# Patient Record
Sex: Female | Born: 1970 | Race: White | Hispanic: No | Smoking: Never smoker
Health system: Southern US, Community
[De-identification: ages and names within clinical notes are randomized; demographics above are authoritative.]

## PROBLEM LIST (undated history)

## (undated) DIAGNOSIS — F410 Panic disorder [episodic paroxysmal anxiety] without agoraphobia: Secondary | ICD-10-CM

## (undated) DIAGNOSIS — F32A Depression, unspecified: Secondary | ICD-10-CM

## (undated) DIAGNOSIS — F419 Anxiety disorder, unspecified: Secondary | ICD-10-CM

## (undated) DIAGNOSIS — F329 Major depressive disorder, single episode, unspecified: Secondary | ICD-10-CM

## (undated) DIAGNOSIS — G4700 Insomnia, unspecified: Secondary | ICD-10-CM

## (undated) HISTORY — PX: GASTRECTOMY: SHX58

## (undated) HISTORY — PX: ABDOMINAL HYSTERECTOMY: SHX81

---

## 2004-05-28 ENCOUNTER — Ambulatory Visit: Payer: Self-pay | Admitting: Family Medicine

## 2005-12-16 DIAGNOSIS — M545 Low back pain, unspecified: Secondary | ICD-10-CM | POA: Insufficient documentation

## 2006-02-09 ENCOUNTER — Ambulatory Visit: Payer: Self-pay | Admitting: Obstetrics and Gynecology

## 2006-03-23 ENCOUNTER — Ambulatory Visit: Payer: Self-pay | Admitting: Obstetrics and Gynecology

## 2006-08-12 ENCOUNTER — Ambulatory Visit: Payer: Self-pay | Admitting: Obstetrics and Gynecology

## 2006-08-17 ENCOUNTER — Ambulatory Visit: Payer: Self-pay | Admitting: Obstetrics and Gynecology

## 2006-08-21 ENCOUNTER — Inpatient Hospital Stay: Payer: Self-pay | Admitting: Obstetrics and Gynecology

## 2007-08-10 ENCOUNTER — Emergency Department: Payer: Self-pay | Admitting: Emergency Medicine

## 2007-09-21 DIAGNOSIS — K219 Gastro-esophageal reflux disease without esophagitis: Secondary | ICD-10-CM | POA: Insufficient documentation

## 2008-04-26 ENCOUNTER — Ambulatory Visit: Payer: Self-pay | Admitting: Obstetrics and Gynecology

## 2009-02-03 DIAGNOSIS — N809 Endometriosis, unspecified: Secondary | ICD-10-CM | POA: Insufficient documentation

## 2009-02-03 DIAGNOSIS — E569 Vitamin deficiency, unspecified: Secondary | ICD-10-CM | POA: Insufficient documentation

## 2009-02-03 DIAGNOSIS — F4321 Adjustment disorder with depressed mood: Secondary | ICD-10-CM | POA: Insufficient documentation

## 2010-09-27 DIAGNOSIS — N958 Other specified menopausal and perimenopausal disorders: Secondary | ICD-10-CM | POA: Insufficient documentation

## 2011-08-25 DIAGNOSIS — M47817 Spondylosis without myelopathy or radiculopathy, lumbosacral region: Secondary | ICD-10-CM | POA: Insufficient documentation

## 2011-09-09 DIAGNOSIS — Z980 Intestinal bypass and anastomosis status: Secondary | ICD-10-CM | POA: Insufficient documentation

## 2011-11-10 DIAGNOSIS — Z79899 Other long term (current) drug therapy: Secondary | ICD-10-CM | POA: Insufficient documentation

## 2011-11-25 DIAGNOSIS — M4310 Spondylolisthesis, site unspecified: Secondary | ICD-10-CM | POA: Insufficient documentation

## 2012-09-10 DIAGNOSIS — G43909 Migraine, unspecified, not intractable, without status migrainosus: Secondary | ICD-10-CM | POA: Insufficient documentation

## 2012-09-10 DIAGNOSIS — Z9884 Bariatric surgery status: Secondary | ICD-10-CM | POA: Insufficient documentation

## 2012-09-10 DIAGNOSIS — Z90710 Acquired absence of both cervix and uterus: Secondary | ICD-10-CM | POA: Insufficient documentation

## 2013-01-14 DIAGNOSIS — F410 Panic disorder [episodic paroxysmal anxiety] without agoraphobia: Secondary | ICD-10-CM | POA: Insufficient documentation

## 2013-01-15 DIAGNOSIS — R4589 Other symptoms and signs involving emotional state: Secondary | ICD-10-CM | POA: Insufficient documentation

## 2013-04-22 ENCOUNTER — Emergency Department: Payer: Self-pay | Admitting: Emergency Medicine

## 2014-03-08 DIAGNOSIS — R7301 Impaired fasting glucose: Secondary | ICD-10-CM | POA: Insufficient documentation

## 2015-07-17 ENCOUNTER — Emergency Department
Admission: EM | Admit: 2015-07-17 | Discharge: 2015-07-17 | Disposition: A | Discharge status: Home / Self Care | Payer: Medicare Other | Attending: Emergency Medicine | Admitting: Emergency Medicine

## 2015-07-17 ENCOUNTER — Emergency Department: Payer: Medicare Other

## 2015-07-17 ENCOUNTER — Encounter: Payer: Self-pay | Admitting: Emergency Medicine

## 2015-07-17 DIAGNOSIS — S20212A Contusion of left front wall of thorax, initial encounter: Secondary | ICD-10-CM | POA: Diagnosis not present

## 2015-07-17 DIAGNOSIS — M545 Low back pain: Secondary | ICD-10-CM | POA: Diagnosis not present

## 2015-07-17 DIAGNOSIS — G8929 Other chronic pain: Secondary | ICD-10-CM

## 2015-07-17 DIAGNOSIS — Y999 Unspecified external cause status: Secondary | ICD-10-CM | POA: Insufficient documentation

## 2015-07-17 DIAGNOSIS — S62327A Displaced fracture of shaft of fifth metacarpal bone, left hand, initial encounter for closed fracture: Secondary | ICD-10-CM | POA: Diagnosis not present

## 2015-07-17 DIAGNOSIS — S62308A Unspecified fracture of other metacarpal bone, initial encounter for closed fracture: Secondary | ICD-10-CM

## 2015-07-17 DIAGNOSIS — F329 Major depressive disorder, single episode, unspecified: Secondary | ICD-10-CM | POA: Insufficient documentation

## 2015-07-17 DIAGNOSIS — Y9389 Activity, other specified: Secondary | ICD-10-CM | POA: Diagnosis not present

## 2015-07-17 DIAGNOSIS — Y92410 Unspecified street and highway as the place of occurrence of the external cause: Secondary | ICD-10-CM | POA: Diagnosis not present

## 2015-07-17 DIAGNOSIS — S62325A Displaced fracture of shaft of fourth metacarpal bone, left hand, initial encounter for closed fracture: Secondary | ICD-10-CM | POA: Diagnosis not present

## 2015-07-17 DIAGNOSIS — M549 Dorsalgia, unspecified: Secondary | ICD-10-CM

## 2015-07-17 DIAGNOSIS — S60922A Unspecified superficial injury of left hand, initial encounter: Secondary | ICD-10-CM | POA: Diagnosis present

## 2015-07-17 DIAGNOSIS — Z23 Encounter for immunization: Secondary | ICD-10-CM | POA: Diagnosis not present

## 2015-07-17 DIAGNOSIS — S91322A Laceration with foreign body, left foot, initial encounter: Secondary | ICD-10-CM | POA: Insufficient documentation

## 2015-07-17 HISTORY — DX: Major depressive disorder, single episode, unspecified: F32.9

## 2015-07-17 HISTORY — DX: Depression, unspecified: F32.A

## 2015-07-17 HISTORY — DX: Panic disorder (episodic paroxysmal anxiety): F41.0

## 2015-07-17 HISTORY — DX: Insomnia, unspecified: G47.00

## 2015-07-17 HISTORY — DX: Anxiety disorder, unspecified: F41.9

## 2015-07-17 LAB — CBC WITH DIFFERENTIAL/PLATELET
Basophils Absolute: 0 10*3/uL (ref 0–0.1)
Basophils Relative: 0 %
EOS ABS: 0 10*3/uL (ref 0–0.7)
Eosinophils Relative: 0 %
HEMATOCRIT: 35.5 % (ref 35.0–47.0)
HEMOGLOBIN: 12.3 g/dL (ref 12.0–16.0)
LYMPHS ABS: 1 10*3/uL (ref 1.0–3.6)
LYMPHS PCT: 10 %
MCH: 30.8 pg (ref 26.0–34.0)
MCHC: 34.5 g/dL (ref 32.0–36.0)
MCV: 89.1 fL (ref 80.0–100.0)
Monocytes Absolute: 0.6 10*3/uL (ref 0.2–0.9)
Monocytes Relative: 6 %
NEUTROS ABS: 8.7 10*3/uL — AB (ref 1.4–6.5)
NEUTROS PCT: 84 %
Platelets: 265 10*3/uL (ref 150–440)
RBC: 3.98 MIL/uL (ref 3.80–5.20)
RDW: 14.3 % (ref 11.5–14.5)
WBC: 10.4 10*3/uL (ref 3.6–11.0)

## 2015-07-17 LAB — COMPREHENSIVE METABOLIC PANEL
ALBUMIN: 4.6 g/dL (ref 3.5–5.0)
ALT: 114 U/L — ABNORMAL HIGH (ref 14–54)
ANION GAP: 8 (ref 5–15)
AST: 245 U/L — AB (ref 15–41)
Alkaline Phosphatase: 165 U/L — ABNORMAL HIGH (ref 38–126)
BUN: 22 mg/dL — ABNORMAL HIGH (ref 6–20)
CO2: 26 mmol/L (ref 22–32)
Calcium: 9.1 mg/dL (ref 8.9–10.3)
Chloride: 101 mmol/L (ref 101–111)
Creatinine, Ser: 0.83 mg/dL (ref 0.44–1.00)
GFR calc Af Amer: >60 mL/min (ref >60)
GFR calc non Af Amer: >60 mL/min (ref >60)
GLUCOSE: 95 mg/dL (ref 65–99)
POTASSIUM: 4.4 mmol/L (ref 3.5–5.1)
SODIUM: 135 mmol/L (ref 135–145)
Total Bilirubin: 0.5 mg/dL (ref 0.3–1.2)
Total Protein: 7.6 g/dL (ref 6.5–8.1)

## 2015-07-17 LAB — ETHANOL: Alcohol, Ethyl (B): <5 mg/dL (ref <5)

## 2015-07-17 LAB — LIPASE, BLOOD: Lipase: 28 U/L (ref 11–51)

## 2015-07-17 MED ORDER — LIDOCAINE-EPINEPHRINE (PF) 1 %-1:200000 IJ SOLN: 30.0000 mL | Freq: Once | INTRAMUSCULAR | Status: DC

## 2015-07-17 MED ORDER — TETANUS-DIPHTH-ACELL PERTUSSIS 5-2.5-18.5 LF-MCG/0.5 IM SUSP
0.5000 mL | Freq: Once | INTRAMUSCULAR | Status: AC
  Administered 2015-07-17: 0.5 mL via INTRAMUSCULAR

## 2015-07-17 MED ORDER — TETANUS-DIPHTH-ACELL PERTUSSIS 5-2.5-18.5 LF-MCG/0.5 IM SUSP
INTRAMUSCULAR | Status: AC
  Administered 2015-07-17: 0.5 mL via INTRAMUSCULAR
  Filled 2015-07-17: qty 0.5

## 2015-07-17 MED ORDER — OXYCODONE-ACETAMINOPHEN 5-325 MG PO TABS: 1.0000 | ORAL_TABLET | Freq: Four times a day (QID) | ORAL | Status: AC | PRN

## 2015-07-17 MED ORDER — CARISOPRODOL 350 MG PO TABS: 350.0000 mg | ORAL_TABLET | Freq: Three times a day (TID) | ORAL | Status: AC | PRN

## 2015-07-17 MED ORDER — SODIUM CHLORIDE 0.9 % IV BOLUS (SEPSIS)
1000.0000 mL | Freq: Once | INTRAVENOUS | Status: AC
Start: 2015-07-17 — End: 2015-07-17
  Administered 2015-07-17: 1000 mL via INTRAVENOUS

## 2015-07-17 MED ORDER — IOPAMIDOL (ISOVUE-370) INJECTION 76%
100.0000 mL | Freq: Once | INTRAVENOUS | Status: AC | PRN
  Administered 2015-07-17: 100 mL via INTRAVENOUS
  Filled 2015-07-17: qty 100
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
## 2015-07-17 NOTE — ED Notes (Signed)
MD at bedside. 

## 2015-07-17 NOTE — Discharge Instructions (Signed)
You were prescribed a medication that is potentially sedating. Do not drink alcohol, drive or participate in any other potentially dangerous activities while taking this medication as it may make you sleepy. Do not take this medication with any other sedating medications, either prescription or over-the-counter. If you were prescribed Percocet or Vicodin, do not take these with acetaminophen (Tylenol) as it is already contained within these medications.   Opioid pain medications (or "narcotics") can be habit forming.  Use it as little as possible to achieve adequate pain control.  Do not use or use it with extreme caution if you have a history of opiate abuse or dependence.  If you are on a pain contract with your primary care doctor or a pain specialist, be sure to let them know you were prescribed this medication today from the [facility].  This medication is intended for your use only - do not give any to anyone else and keep it in a secure place where nobody else, especially children and pets, have access to it.  It will also cause or worsen constipation, so you may want to consider taking an over-the-counter stool softener while you are taking this medication.  Back Pain, Adult Back pain is very common in adults.The cause of back pain is rarely dangerous and the pain often gets better over time.The cause of your back pain may not be known. Some common causes of back pain include:  Strain of the muscles or ligaments supporting the spine.  Wear and tear (degeneration) of the spinal disks.  Arthritis.  Direct injury to the back. For many people, back pain may return. Since back pain is rarely dangerous, most people can learn to manage this condition on their own. HOME CARE INSTRUCTIONS Watch your back pain for any changes. The following actions may help to lessen any discomfort you are feeling:  Remain active. It is stressful on your back to sit or stand in one place  for long periods of time. Do not sit, drive, or stand in one place for more than 30 minutes at a time. Take short walks on even surfaces as soon as you are able.Try to increase the length of time you walk each day.  Exercise regularly as directed by your health care provider. Exercise helps your back heal faster. It also helps avoid future injury by keeping your muscles strong and flexible.  Do not stay in bed.Resting more than 1-2 days can delay your recovery.  Pay attention to your body when you bend and lift. The most comfortable positions are those that put less stress on your recovering back. Always use proper lifting techniques, including:  Bending your knees.  Keeping the load close to your body.  Avoiding twisting.  Find a comfortable position to sleep. Use a firm mattress and lie on your side with your knees slightly bent. If you lie on your back, put a pillow under your knees.  Avoid feeling anxious or stressed.Stress increases muscle tension and can worsen back pain.It is important to recognize when you are anxious or stressed and learn ways to manage it, such as with exercise.  Take medicines only as directed by your health care provider. Over-the-counter medicines to reduce pain and inflammation are often the most helpful.Your health care provider may prescribe muscle relaxant drugs.These medicines help dull your pain so you can more quickly return to your normal activities and healthy exercise.  Apply ice to the injured area:  Put ice in a plastic  bag.  Place a towel between your skin and the bag.  Leave the ice on for 20 minutes, 2-3 times a day for the first 2-3 days. After that, ice and heat may be alternated to reduce pain and spasms.  Maintain a healthy weight. Excess weight puts extra stress on your back and makes it difficult to maintain good posture. SEEK MEDICAL CARE IF:  You have pain that is not relieved with rest or medicine.  You have increasing pain  going down into the legs or buttocks.  You have pain that does not improve in one week.  You have night pain.  You lose weight.  You have a fever or chills. SEEK IMMEDIATE MEDICAL CARE IF:   You develop new bowel or bladder control problems.  You have unusual weakness or numbness in your arms or legs.  You develop nausea or vomiting.  You develop abdominal pain.  You feel faint.   This information is not intended to replace advice given to you by your health care provider. Make sure you discuss any questions you have with your health care provider.   Document Released: 04/07/2005 Document Revised: 04/28/2014 Document Reviewed: 08/09/2013 Elsevier Interactive Patient Education 2016 Elsevier Inc.  Cast or Splint Care Casts and splints support injured limbs and keep bones from moving while they heal. It is important to care for your cast or splint at home.  HOME CARE INSTRUCTIONS  Keep the cast or splint uncovered during the drying period. It can take 24 to 48 hours to dry if it is made of plaster. A fiberglass cast will dry in less than 1 hour.  Do not rest the cast on anything harder than a pillow for the first 24 hours.  Do not put weight on your injured limb or apply pressure to the cast until your health care provider gives you permission.  Keep the cast or splint dry. Wet casts or splints can lose their shape and may not support the limb as well. A wet cast that has lost its shape can also create harmful pressure on your skin when it dries. Also, wet skin can become infected.  Cover the cast or splint with a plastic bag when bathing or when out in the rain or snow. If the cast is on the trunk of the body, take sponge baths until the cast is removed.  If your cast does become wet, dry it with a towel or a blow dryer on the cool setting only.  Keep your cast or splint clean. Soiled casts may be wiped with a moistened cloth.  Do not place any hard or soft foreign objects  under your cast or splint, such as cotton, toilet paper, lotion, or powder.  Do not try to scratch the skin under the cast with any object. The object could get stuck inside the cast. Also, scratching could lead to an infection. If itching is a problem, use a blow dryer on a cool setting to relieve discomfort.  Do not trim or cut your cast or remove padding from inside of it.  Exercise all joints next to the injury that are not immobilized by the cast or splint. For example, if you have a long leg cast, exercise the hip joint and toes. If you have an arm cast or splint, exercise the shoulder, elbow, thumb, and fingers.  Elevate your injured arm or leg on 1 or 2 pillows for the first 1 to 3 days to decrease swelling and pain.It is best  if you can comfortably elevate your cast so it is higher than your heart. SEEK MEDICAL CARE IF:   Your cast or splint cracks.  Your cast or splint is too tight or too loose.  You have unbearable itching inside the cast.  Your cast becomes wet or develops a soft spot or area.  You have a bad smell coming from inside your cast.  You get an object stuck under your cast.  Your skin around the cast becomes red or raw.  You have new pain or worsening pain after the cast has been applied. SEEK IMMEDIATE MEDICAL CARE IF:   You have fluid leaking through the cast.  You are unable to move your fingers or toes.  You have discolored (blue or white), cool, painful, or very swollen fingers or toes beyond the cast.  You have tingling or numbness around the injured area.  You have severe pain or pressure under the cast.  You have any difficulty with your breathing or have shortness of breath.  You have chest pain.   This information is not intended to replace advice given to you by your health care provider. Make sure you discuss any questions you have with your health care provider.   Document Released: 04/04/2000 Document Revised: 01/26/2013 Document  Reviewed: 10/14/2012 Elsevier Interactive Patient Education 2016 Elsevier Inc.  Chest Contusion A chest contusion is a deep bruise on your chest area. Contusions are the result of an injury that caused bleeding under the skin. A chest contusion may involve bruising of the skin, muscles, or ribs. The contusion may turn blue, purple, or yellow. Minor injuries will give you a painless contusion, but more severe contusions may stay painful and swollen for a few weeks. CAUSES  A contusion is usually caused by a blow, trauma, or direct force to an area of the body. SYMPTOMS   Swelling and redness of the injured area.  Discoloration of the injured area.  Tenderness and soreness of the injured area.  Pain. DIAGNOSIS  The diagnosis can be made by taking a history and performing a physical exam. An X-ray, CT scan, or MRI may be needed to determine if there were any associated injuries, such as broken bones (fractures) or internal injuries. TREATMENT  Often, the best treatment for a chest contusion is resting, icing, and applying cold compresses to the injured area. Deep breathing exercises may be recommended to reduce the risk of pneumonia. Over-the-counter medicines may also be recommended for pain control. HOME CARE INSTRUCTIONS   Put ice on the injured area.  Put ice in a plastic bag.  Place a towel between your skin and the bag.  Leave the ice on for 15-20 minutes, 03-04 times a day.  Only take over-the-counter or prescription medicines as directed by your caregiver. Your caregiver may recommend avoiding anti-inflammatory medicines (aspirin, ibuprofen, and naproxen) for 48 hours because these medicines may increase bruising.  Rest the injured area.  Perform deep-breathing exercises as directed by your caregiver.  Stop smoking if you smoke.  Do not lift objects over 5 pounds (2.3 kg) for 3 days or longer if recommended by your caregiver. SEEK IMMEDIATE MEDICAL CARE IF:   You have  increased bruising or swelling.  You have pain that is getting worse.  You have difficulty breathing.  You have dizziness, weakness, or fainting.  You have blood in your urine or stool.  You cough up or vomit blood.  Your swelling or pain is not relieved with medicines. MAKE  SURE YOU:   Understand these instructions.  Will watch your condition.  Will get help right away if you are not doing well or get worse.   This information is not intended to replace advice given to you by your health care provider. Make sure you discuss any questions you have with your health care provider.   Document Released: 12/31/2000 Document Revised: 12/31/2011 Document Reviewed: 09/29/2011 Elsevier Interactive Patient Education 2016 Elsevier Inc.  Sutured Wound Care Sutures are stitches that can be used to close wounds. Taking care of your wound properly can help to prevent pain and infection. It can also help your wound to heal more quickly. HOW TO CARE FOR YOUR SUTURED WOUND Wound Care  Keep the wound clean and dry.  If you were given a bandage (dressing), you should change it at least once per day or as directed by your health care provider. You should also change it if it becomes wet or dirty.  Keep the wound completely dry for the first 24 hours or as directed by your health care provider. After that time, you may shower or bathe. However, make sure that the wound is not soaked in water until the sutures have been removed.  Clean the wound one time each day or as directed by your health care provider.  Wash the wound with soap and water.  Rinse the wound with water to remove all soap.  Pat the wound dry with a clean towel. Do not rub the wound.  Aftercleaning the wound, apply a thin layer of antibioticointment as directed by your health care provider. This will help to prevent infection and keep the dressing from sticking to the wound.  Have the sutures removed as directed by your  health care provider. General Instructions  Take or apply medicines only as directed by your health care provider.  To help prevent scarring, make sure to cover your wound with sunscreen whenever you are outside after the sutures are removed and the wound is healed. Make sure to wear a sunscreen of at least 30 SPF.  If you were prescribed an antibiotic medicine or ointment, finish all of it even if you start to feel better.  Do not scratch or pick at the wound.  Keep all follow-up visits as directed by your health care provider. This is important.  Check your wound every day for signs of infection. Watch for:   Redness, swelling, or pain.  Fluid, blood, or pus.  Raise (elevate) the injured area above the level of your heart while you are sitting or lying down, if possible.  Avoid stretching your wound.  Drink enough fluids to keep your urine clear or pale yellow. SEEK MEDICAL CARE IF:  You received a tetanus shot and you have swelling, severe pain, redness, or bleeding at the injection site.  You have a fever.  A wound that was closed breaks open.  You notice a bad smell coming from the wound.  You notice something coming out of the wound, such as wood or glass.  Your pain is not controlled with medicine.  You have increased redness, swelling, or pain at the site of your wound.  You have fluid, blood, or pus coming from your wound.  You notice a change in the color of your skin near your wound.  You need to change the dressing frequently due to fluid, blood, or pus draining from the wound.  You develop a new rash.  You develop numbness around the wound. SEEK  IMMEDIATE MEDICAL CARE IF:  You develop severe swelling around the injury site.  Your pain suddenly increases and is severe.  You develop painful lumps near the wound or on skin that is anywhere on your body.  You have a red streak going away from your wound.  The wound is on your hand or foot and you  cannot properly move a finger or toe.  The wound is on your hand or foot and you notice that your fingers or toes look pale or bluish.   This information is not intended to replace advice given to you by your health care provider. Make sure you discuss any questions you have with your health care provider.   Document Released: 05/15/2004 Document Revised: 08/22/2014 Document Reviewed: 11/17/2012 Elsevier Interactive Patient Education [employer]

## 2015-07-17 NOTE — ED Notes (Signed)
Patient transported to CT 

## 2015-07-24 DIAGNOSIS — S62307A Unspecified fracture of fifth metacarpal bone, left hand, initial encounter for closed fracture: Secondary | ICD-10-CM | POA: Insufficient documentation

## 2015-08-09 DIAGNOSIS — S62307S Unspecified fracture of fifth metacarpal bone, left hand, sequela: Secondary | ICD-10-CM | POA: Insufficient documentation

## 2016-05-26 DIAGNOSIS — M2142 Flat foot [pes planus] (acquired), left foot: Secondary | ICD-10-CM | POA: Insufficient documentation

## 2017-01-20 DIAGNOSIS — F32A Depression, unspecified: Secondary | ICD-10-CM | POA: Insufficient documentation

## 2017-01-20 DIAGNOSIS — G4733 Obstructive sleep apnea (adult) (pediatric): Secondary | ICD-10-CM | POA: Insufficient documentation

## 2019-01-10 DIAGNOSIS — F3341 Major depressive disorder, recurrent, in partial remission: Secondary | ICD-10-CM | POA: Insufficient documentation

## 2019-02-15 DIAGNOSIS — D508 Other iron deficiency anemias: Secondary | ICD-10-CM | POA: Insufficient documentation

## 2020-02-27 ENCOUNTER — Other Ambulatory Visit: Payer: Self-pay | Admitting: Physician Assistant

## 2020-02-27 DIAGNOSIS — M62838 Other muscle spasm: Secondary | ICD-10-CM

## 2020-07-27 ENCOUNTER — Other Ambulatory Visit: Payer: Self-pay | Admitting: Physician Assistant

## 2020-07-27 DIAGNOSIS — Z1231 Encounter for screening mammogram for malignant neoplasm of breast: Secondary | ICD-10-CM

## 2020-08-07 ENCOUNTER — Other Ambulatory Visit: Payer: Self-pay

## 2020-08-07 ENCOUNTER — Telehealth (INDEPENDENT_AMBULATORY_CARE_PROVIDER_SITE_OTHER): Payer: Self-pay | Admitting: Gastroenterology

## 2020-08-07 DIAGNOSIS — Z1211 Encounter for screening for malignant neoplasm of colon: Secondary | ICD-10-CM

## 2020-08-07 MED ORDER — NA SULFATE-K SULFATE-MG SULF 17.5-3.13-1.6 GM/177ML PO SOLN: 1.0000 | Freq: Once | ORAL | 0 refills | Status: AC

## 2020-08-07 NOTE — Progress Notes (Signed)
Gastroenterology Pre-Procedure Review  Request Date: Thursday 08/23/20 Requesting Physician: Dr. Tobi Bastos  PATIENT REVIEW QUESTIONS: The patient responded to the following health history questions as indicated:    1. Are you having any GI issues? No however, she had Diarrhea last week when she was in [place].  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 Do you have a personal history of Polyps? no 3. Do you have a family history of Colon Cancer or Polyps? yes (Maternal grandfather colon cancer) 4. Diabetes Mellitus? hypoglycemia 5. Joint replacements in the past 12 months?no 6. Major health problems in the past 3 months?no 7. Any artificial heart valves, MVP, or defibrillator?no    MEDICATIONS & ALLERGIES:    Patient reports the following regarding taking any anticoagulation/antiplatelet therapy:   Plavix, Coumadin, Eliquis, Xarelto, Lovenox, Pradaxa, Brilinta, or Effient? no Aspirin? no  Patient confirms/reports the following medications:  Current Outpatient Medications  Medication Sig Dispense Refill  . acetaminophen (TYLENOL) 500 MG tablet Take by mouth.    . Biotin 1000 MCG CHEW biotin 1,000 mcg chewable tablet    . buPROPion (WELLBUTRIN SR) 200 MG 12 hr tablet bupropion HCl SR 200 mg tablet,12 hr sustained-release    . busPIRone (BUSPAR) 15 MG tablet Take 15 mg by mouth 2 (two) times daily.    . carisoprodol (SOMA) 350 MG tablet Take 1 tablet (350 mg total) by mouth 3 (three) times daily as needed for muscle spasms. 10 tablet 0  . Cholecalciferol 50 MCG (2000 UT) TABS D3 DOTS 50 mcg (2,000 unit) tablet    . clonazePAM (KLONOPIN PO) clonazepam    . cyanocobalamin 1000 MCG tablet Take by mouth.    . Escitalopram Oxalate (LEXAPRO PO) Lexapro    . estradiol (ESTRACE) 1 MG tablet Take 1 mg by mouth daily.    . ferrous sulfate 325 (65 FE) MG tablet Iron (ferrous sulfate) 325 mg (65 mg iron) tablet    . fluticasone (FLONASE) 50 MCG/ACT nasal spray fluticasone propionate 50  mcg/actuation nasal spray,suspension    . gabapentin (NEURONTIN) 300 MG capsule TAKE 2-3 CAPSULES BY MOUTH 3 TIMES A DAY    . hydrOXYzine (ATARAX/VISTARIL) 50 MG tablet hydroxyzine HCl 50 mg tablet    . meloxicam (MOBIC) 7.5 MG tablet Take 7.5 mg by mouth 2 (two) times daily.    . ondansetron (ZOFRAN) 4 MG tablet Take 1 tablet by mouth every 8 (eight) hours as needed.    Marland Kitchen oxyCODONE-acetaminophen (ROXICET) 5-325 MG tablet Take 1 tablet by mouth every 6 (six) hours as needed for severe pain. 12 tablet 0  . pantoprazole (PROTONIX) 20 MG tablet pantoprazole 20 mg tablet,delayed release    . phentermine (ADIPEX-P) 37.5 MG tablet phentermine 37.5 mg tablet    . polyethylene glycol powder (GLYCOLAX/MIRALAX) 17 GM/SCOOP powder Gavilax 17 gram/dose oral powder  PLEASE SEE ATTACHED FOR DETAILED DIRECTIONS    . traZODone (DESYREL) 50 MG tablet trazodone 50 mg tablet     No current facility-administered medications for this visit.    Patient confirms/reports the following allergies:  Allergies  Allergen Reactions  . Dimenhydrinate Hives and Itching    Dramamine   . Diphenhydramine Hcl Other (See Comments)    Increases anxiety  . Morpholine Salicylate Itching  . Other Hives, Itching and Swelling    Other reaction(s): Unknown  . Sulfa Antibiotics Other (See Comments)    Gastric bypass   . Tapentadol Hives and Itching  . Amoxicillin Rash    11/19: pt states she is fine  with taking amoxicillin   . Diphenhydramine Anxiety and Other (See Comments)    Causes anxiety   . Morphine And Related     No orders of the defined types were placed in this encounter.   AUTHORIZATION INFORMATION Primary Insurance: 1D#: Group #:  Secondary Insurance: 1D#: Group #:  SCHEDULE INFORMATION: Date: 08/23/20 Time: Location:[facility]

## 2020-08-16 ENCOUNTER — Other Ambulatory Visit: Payer: Self-pay

## 2020-08-16 ENCOUNTER — Ambulatory Visit
Admission: RE | Admit: 2020-08-16 | Discharge: 2020-08-16 | Disposition: A | Discharge status: Home / Self Care | Payer: Medicare Other | Source: Ambulatory Visit | Attending: Physician Assistant | Admitting: Physician Assistant

## 2020-08-16 DIAGNOSIS — Z1231 Encounter for screening mammogram for malignant neoplasm of breast: Secondary | ICD-10-CM | POA: Insufficient documentation

## 2020-08-23 ENCOUNTER — Other Ambulatory Visit: Payer: Self-pay

## 2020-08-23 ENCOUNTER — Ambulatory Visit
Admission: RE | Admit: 2020-08-23 | Discharge: 2020-08-23 | Disposition: A | Discharge status: Home / Self Care | Payer: Medicare Other | Source: Home / Self Care | Attending: Gastroenterology | Admitting: Gastroenterology

## 2020-08-23 ENCOUNTER — Ambulatory Visit: Payer: Medicare Other | Admitting: Anesthesiology

## 2020-08-23 ENCOUNTER — Encounter
Admission: RE | Disposition: A | Discharge status: Home / Self Care | Payer: Self-pay | Source: Home / Self Care | Attending: Gastroenterology

## 2020-08-23 ENCOUNTER — Telehealth: Payer: Self-pay | Admitting: Gastroenterology

## 2020-08-23 ENCOUNTER — Encounter: Payer: Self-pay | Admitting: Gastroenterology

## 2020-08-23 SURGERY — COLONOSCOPY WITH PROPOFOL
Anesthesia: General

## 2020-08-23 MED ORDER — PEG 3350-KCL-NA BICARB-NACL 420 G PO SOLR: 4000.0000 mL | Freq: Once | ORAL | 0 refills | Status: AC

## 2020-08-23 NOTE — Progress Notes (Signed)
New prep sent to pharmacy and instructions.

## 2020-08-23 NOTE — Anesthesia Preprocedure Evaluation (Addendum)
Anesthesia Evaluation  Patient identified by MRN, date of birth, ID band Patient awake    Reviewed: Allergy & Precautions, NPO status , Patient's Chart, lab work & pertinent test results  Airway Mallampati: III       Dental  (+) Upper Dentures, Lower Dentures   Pulmonary sleep apnea ,    Pulmonary exam normal        Cardiovascular negative cardio ROS Normal cardiovascular exam     Neuro/Psych  Headaches, PSYCHIATRIC DISORDERS Anxiety Depression    GI/Hepatic Neg liver ROS, GERD  ,  Endo/Other  Morbid obesity  Renal/GU negative Renal ROS  negative genitourinary   Musculoskeletal  (+) Arthritis , Osteoarthritis,    Abdominal Normal abdominal exam  (+)   Peds negative pediatric ROS (+)  Hematology  (+) anemia ,   Anesthesia Other Findings Past Medical History: No date: Anxiety No date: Depression No date: Insomnia No date: Panic attack  Reproductive/Obstetrics                            Anesthesia Physical Anesthesia Plan  ASA: II  Anesthesia Plan: General   Post-op Pain Management:    Induction: Intravenous  PONV Risk Score and Plan: Propofol infusion  Airway Management Planned: Nasal Cannula  Additional Equipment:   Intra-op Plan:   Post-operative Plan:   Informed Consent: I have reviewed the patients History and Physical, chart, labs and discussed the procedure including the risks, benefits and alternatives for the proposed anesthesia with the patient or authorized representative who has indicated his/her understanding and acceptance.     Dental advisory given  Plan Discussed with: CRNA and Surgeon  Anesthesia Plan Comments:         Anesthesia Quick Evaluation

## 2020-08-23 NOTE — Telephone Encounter (Signed)
Patient called asking about more prep that Dr Tobi Bastos was sending to [facility] [employer] [facility] [address].  Please call to advise.  Patient stated she [place] way out and is waiting for call back at [employer] right now.

## 2020-08-23 NOTE — Telephone Encounter (Signed)
Returned patients call. Informed patient new prep has been sent and instructions. Instructed patient to take a laxative between 1pm and 3pm to help with bowel movement. Clear liquid diet all day. Pt stated she did have a biscuit this morning. Provider has been informed of this conversation.

## 2020-08-24 ENCOUNTER — Ambulatory Visit
Admission: RE | Admit: 2020-08-24 | Discharge: 2020-08-24 | Disposition: A | Discharge status: Home / Self Care | Payer: Medicare Other | Attending: Gastroenterology | Admitting: Gastroenterology

## 2020-08-24 ENCOUNTER — Ambulatory Visit: Payer: Medicare Other | Admitting: Certified Registered"

## 2020-08-24 ENCOUNTER — Encounter
Admission: RE | Disposition: A | Discharge status: Home / Self Care | Payer: Self-pay | Source: Home / Self Care | Attending: Gastroenterology

## 2020-08-24 DIAGNOSIS — Z79890 Hormone replacement therapy (postmenopausal): Secondary | ICD-10-CM | POA: Diagnosis not present

## 2020-08-24 DIAGNOSIS — Z882 Allergy status to sulfonamides status: Secondary | ICD-10-CM | POA: Insufficient documentation

## 2020-08-24 DIAGNOSIS — Z880 Allergy status to penicillin: Secondary | ICD-10-CM | POA: Diagnosis not present

## 2020-08-24 DIAGNOSIS — Z888 Allergy status to other drugs, medicaments and biological substances status: Secondary | ICD-10-CM | POA: Insufficient documentation

## 2020-08-24 DIAGNOSIS — Z1211 Encounter for screening for malignant neoplasm of colon: Secondary | ICD-10-CM | POA: Diagnosis not present

## 2020-08-24 DIAGNOSIS — Z791 Long term (current) use of non-steroidal anti-inflammatories (NSAID): Secondary | ICD-10-CM | POA: Diagnosis not present

## 2020-08-24 DIAGNOSIS — Z79899 Other long term (current) drug therapy: Secondary | ICD-10-CM | POA: Diagnosis not present

## 2020-08-24 DIAGNOSIS — Z885 Allergy status to narcotic agent status: Secondary | ICD-10-CM | POA: Diagnosis not present

## 2020-08-24 HISTORY — PX: COLONOSCOPY WITH PROPOFOL: SHX5780

## 2020-08-24 LAB — GLUCOSE, CAPILLARY: Glucose-Capillary: 93 mg/dL (ref 70–99)

## 2020-08-24 SURGERY — COLONOSCOPY WITH PROPOFOL
Anesthesia: General

## 2020-08-24 MED ORDER — PROPOFOL 500 MG/50ML IV EMUL
INTRAVENOUS | Status: AC
  Filled 2020-08-24: qty 50

## 2020-08-24 MED ORDER — PROPOFOL 500 MG/50ML IV EMUL
INTRAVENOUS | Status: DC | PRN
  Administered 2020-08-24: 150 ug/kg/min via INTRAVENOUS

## 2020-08-24 MED ORDER — SODIUM CHLORIDE 0.9 % IV SOLN
INTRAVENOUS | Status: DC
  Administered 2020-08-24: 20 mL/h via INTRAVENOUS

## 2020-08-24 MED ORDER — PROPOFOL 10 MG/ML IV BOLUS
INTRAVENOUS | Status: DC | PRN
  Administered 2020-08-24: 60 mg via INTRAVENOUS

## 2020-08-24 MED ORDER — LIDOCAINE HCL (CARDIAC) PF 100 MG/5ML IV SOSY
PREFILLED_SYRINGE | INTRAVENOUS | Status: DC | PRN
  Administered 2020-08-24: 40 mg via INTRAVENOUS

## 2020-08-24 NOTE — Op Note (Signed)
[facility] Gastroenterology Patient Name: Karen Ponce Procedure Date: 08/24/2020 10:23 AM MRN: [id number] Account #: [id number] Date of Birth: Aug 28, 1970 Admit Type: Outpatient Age: 50 Room: [facility] ENDO ROOM 4 Gender: Female Note Status: Finalized Procedure:             Colonoscopy Indications:           Screening for colorectal malignant neoplasm Providers:             Wyline Mood MD, MD Referring MD:          Humberto Seals A. PA Medicines:             Monitored Anesthesia Care Complications:         No immediate complications. Procedure:             Pre-Anesthesia Assessment:                        - Prior to the procedure, a History and Physical was                         performed, and patient medications, allergies and                         sensitivities were reviewed. The patient's tolerance                         of previous anesthesia was reviewed.                        - The risks and benefits of the procedure and the                         sedation options and risks were discussed with the                         patient. All questions were answered and informed                         consent was obtained.                        - ASA Grade Assessment: II - A patient with mild                         systemic disease.                        After obtaining informed consent, the colonoscope was                         passed under direct vision. Throughout the procedure,                         the patient's blood pressure, pulse, and oxygen                         saturations were monitored continuously. The                         Colonoscope was introduced through the anus and  advanced to the the cecum, identified by the                         appendiceal orifice. The colonoscopy was performed                         with ease. The patient tolerated the procedure well.                         The quality of the bowel  preparation was good. Findings:      The perianal and digital rectal examinations were normal.      The entire examined colon appeared normal on direct and retroflexion       views. Impression:            - The entire examined colon is normal on direct and                         retroflexion views.                        - No specimens collected. Recommendation:        - Discharge patient to home.                        - Resume previous diet.                        - Continue present medications.                        - Repeat colonoscopy in 10 years for screening                         purposes. Procedure Code(s):     --- Professional ---                        [phone], Colonoscopy, flexible; diagnostic, including                         collection of specimen(s) by brushing or washing, when                         performed (separate procedure) CPT copyright 2019 [employer] Association. All rights reserved. The codes documented in this report are preliminary and upon coder review may  be revised to meet current compliance requirements. Wyline Mood, MD Wyline Mood MD, MD 08/24/2020 10:52:04 AM This report has been signed electronically. Number of Addenda: 0 Note Initiated On: 08/24/2020 10:23 AM Scope Withdrawal Time: 0 hours 9 minutes 58 seconds  Total Procedure Duration: 0 hours 17 minutes 15 seconds  Estimated Blood Loss:  Estimated blood loss: none.      [facility]

## 2020-08-24 NOTE — Anesthesia Procedure Notes (Signed)
Procedure Name: MAC Date/Time: 08/24/2020 10:32 AM Performed by: Jerrye Noble, CRNA Pre-anesthesia Checklist: Patient identified, Emergency Drugs available, Suction available and Patient being monitored Patient Re-evaluated:Patient Re-evaluated prior to induction Oxygen Delivery Method: Nasal cannula

## 2020-08-24 NOTE — Transfer of Care (Signed)
Immediate Anesthesia Transfer of Care Note  Patient: Karen Ponce  Procedure(s) Performed: COLONOSCOPY WITH PROPOFOL (N/A )  Patient Location: PACU and Endoscopy Unit  Anesthesia Type:General  Level of Consciousness: awake, drowsy and patient cooperative  Airway & Oxygen Therapy: Patient Spontanous Breathing  Post-op Assessment: Report given to RN and Post -op Vital signs reviewed and stable  Post vital signs: Reviewed and stable  Last Vitals:  Vitals Value Taken Time  BP 100/54 08/24/20 1053  Temp    Pulse 61 08/24/20 1053  Resp 17 08/24/20 1053  SpO2 98 % 08/24/20 1053  Vitals shown include unvalidated device data.  Last Pain:  Vitals:   08/24/20 1053  TempSrc:   PainSc: 0-No pain         Complications: No complications documented.

## 2020-08-24 NOTE — H&P (Signed)
Wyline Mood, MD [address], [address], [place], [place], [address] [address], [place], [place], [address] Phone: [phone]  Fax: [phone]  Primary Care Physician:  Gildardo Pounds, PA   Pre-Procedure History & Physical: HPI:  Karen Ponce is a 50 y.o. female is here for an colonoscopy.   Past Medical History:  Diagnosis Date  . Anxiety   . Depression   . Insomnia   . Panic attack     Past Surgical History:  Procedure Laterality Date  . ABDOMINAL HYSTERECTOMY    . GASTRECTOMY      Prior to Admission medications   Medication Sig Start Date End Date Taking? Authorizing Provider  acetaminophen (TYLENOL) 500 MG tablet Take by mouth.   Yes [provider]  Biotin 1000 MCG CHEW biotin 1,000 mcg chewable tablet   Yes [provider]  buPROPion (WELLBUTRIN SR) 200 MG 12 hr tablet bupropion HCl SR 200 mg tablet,12 hr sustained-release 05/03/09  Yes [provider]  busPIRone (BUSPAR) 15 MG tablet Take 15 mg by mouth 2 (two) times daily.   Yes [provider]  carisoprodol (SOMA) 350 MG tablet Take 1 tablet (350 mg total) by mouth 3 (three) times daily as needed for muscle spasms. 07/17/15  Yes Sharman Cheek, MD  Cholecalciferol 50 MCG (2000 UT) TABS D3 DOTS 50 mcg (2,000 unit) tablet   Yes [provider]  clonazePAM (KLONOPIN PO) clonazepam   Yes [provider]  cyanocobalamin 1000 MCG tablet Take by mouth.   Yes [provider]  Escitalopram Oxalate (LEXAPRO PO) Lexapro   Yes [provider]  estradiol (ESTRACE) 1 MG tablet Take 1 mg by mouth daily. 03/26/20  Yes [provider]  ferrous sulfate 325 (65 FE) MG tablet Iron (ferrous sulfate) 325 mg (65 mg iron) tablet   Yes [provider]  fluticasone (FLONASE) 50 MCG/ACT nasal spray fluticasone propionate 50 mcg/actuation nasal spray,suspension   Yes [provider]  gabapentin (NEURONTIN) 300 MG capsule  TAKE 2-3 CAPSULES BY MOUTH 3 TIMES A DAY 07/10/20  Yes [provider]  hydrOXYzine (ATARAX/VISTARIL) 50 MG tablet hydroxyzine HCl 50 mg tablet 10/17/15  Yes [provider]  meloxicam (MOBIC) 7.5 MG tablet Take 7.5 mg by mouth 2 (two) times daily. 02/24/20  Yes [provider]  ondansetron (ZOFRAN) 4 MG tablet Take 1 tablet by mouth every 8 (eight) hours as needed. 07/29/16  Yes [provider]  pantoprazole (PROTONIX) 20 MG tablet pantoprazole 20 mg tablet,delayed release   Yes [provider]  polyethylene glycol powder (GLYCOLAX/MIRALAX) 17 GM/SCOOP powder Gavilax 17 gram/dose oral powder  PLEASE SEE ATTACHED FOR DETAILED DIRECTIONS 06/14/16  Yes [provider]  oxyCODONE-acetaminophen (ROXICET) 5-325 MG tablet Take 1 tablet by mouth every 6 (six) hours as needed for severe pain. Patient not taking: Reported on 08/23/2020 07/17/15   Sharman Cheek, MD  phentermine (ADIPEX-P) 37.5 MG tablet phentermine 37.5 mg tablet Patient not taking: Reported on 08/23/2020 06/14/20   [provider]  traZODone (DESYREL) 50 MG tablet trazodone 50 mg tablet 04/18/15   [provider]    Allergies as of 08/23/2020 - Review Complete 08/23/2020  Allergen Reaction Noted  . Dimenhydrinate Hives and Itching 06/01/2014  . Diphenhydramine hcl Other (See Comments) 03/09/2017  . Morpholine salicylate Itching 02/28/2020  . Other Hives, Itching, and Swelling 07/17/2015  . Sulfa antibiotics Other (See Comments) 03/01/2018  . Tapentadol Hives and Itching 03/31/2019  .  Amoxicillin Rash 08/07/2020  . Diphenhydramine Anxiety and Other (See Comments) 09/10/2012  . Morphine and related  07/17/2015    No family history on file.  Social History   Socioeconomic History  . Marital status: Divorced    Spouse name: Not on file  . Number of children: Not on file  . Years of education: Not on file  . Highest education level: Not on file  Occupational  History  . Not on file  Tobacco Use  . Smoking status: Never Smoker  . Smokeless tobacco: Never Used  Vaping Use  . Vaping Use: Never used  Substance and Sexual Activity  . Alcohol use: No  . Drug use: Never  . Sexual activity: Not on file  Other Topics Concern  . Not on file  Social History Narrative  . Not on file   Social Determinants of Health   Financial Resource Strain: Not on file  Food Insecurity: Not on file  Transportation Needs: Not on file  Physical Activity: Not on file  Stress: Not on file  Social Connections: Not on file  Intimate Partner Violence: Not on file    Review of Systems: See HPI, otherwise negative ROS  Physical Exam: BP 118/82   Pulse 75   Temp (!) 96.8 F (36 C) (Temporal)   Resp 20   Ht 5' 6.5" (1.689 m)   Wt 122 kg   SpO2 100%   BMI 42.77 kg/m  General:   Alert,  pleasant and cooperative in NAD Head:  Normocephalic and atraumatic. Neck:  Supple; no masses or thyromegaly. Lungs:  Clear throughout to auscultation, normal respiratory effort.    Heart:  +S1, +S2, Regular rate and rhythm, No edema. Abdomen:  Soft, nontender and nondistended. Normal bowel sounds, without guarding, and without rebound.   Neurologic:  Alert and  oriented x4;  grossly normal neurologically.  Impression/Plan: Karen Ponce is here for an colonoscopy to be performed for Screening colonoscopy average risk   Risks, benefits, limitations, and alternatives regarding  colonoscopy have been reviewed with the patient.  Questions have been answered.  All parties agreeable.   Wyline Mood, MD  08/24/2020, 10:24 AM

## 2020-08-24 NOTE — Anesthesia Preprocedure Evaluation (Signed)
Anesthesia Evaluation  Patient identified by MRN, date of birth, ID band Patient awake    Reviewed: Allergy & Precautions, H&P , NPO status , Patient's Chart, lab work & pertinent test results, reviewed documented beta blocker date and time   Airway Mallampati: III   Neck ROM: full    Dental  (+) Poor Dentition   Pulmonary sleep apnea and Continuous Positive Airway Pressure Ventilation ,    Pulmonary exam normal        Cardiovascular negative cardio ROS Normal cardiovascular exam Rhythm:regular Rate:Normal     Neuro/Psych  Headaches, PSYCHIATRIC DISORDERS Anxiety Depression    GI/Hepatic Neg liver ROS, GERD  Medicated,  Endo/Other  negative endocrine ROS  Renal/GU negative Renal ROS  negative genitourinary   Musculoskeletal   Abdominal   Peds  Hematology  (+) Blood dyscrasia, anemia ,   Anesthesia Other Findings Past Medical History: No date: Anxiety No date: Depression No date: Insomnia No date: Panic attack Past Surgical History: No date: ABDOMINAL HYSTERECTOMY No date: GASTRECTOMY BMI    Body Mass Index: 42.77 kg/m     Reproductive/Obstetrics negative OB ROS                             Anesthesia Physical Anesthesia Plan  ASA: III  Anesthesia Plan: General   Post-op Pain Management:    Induction:   PONV Risk Score and Plan:   Airway Management Planned:   Additional Equipment:   Intra-op Plan:   Post-operative Plan:   Informed Consent: I have reviewed the patients History and Physical, chart, labs and discussed the procedure including the risks, benefits and alternatives for the proposed anesthesia with the patient or authorized representative who has indicated his/her understanding and acceptance.     Dental Advisory Given  Plan Discussed with: CRNA  Anesthesia Plan Comments:         Anesthesia Quick Evaluation

## 2020-08-27 ENCOUNTER — Encounter: Payer: Self-pay | Admitting: Gastroenterology

## 2020-08-27 NOTE — Anesthesia Postprocedure Evaluation (Signed)
Anesthesia Post Note  Patient: Karen Ponce  Procedure(s) Performed: COLONOSCOPY WITH PROPOFOL (N/A )  Patient location during evaluation: PACU Anesthesia Type: General Level of consciousness: awake and alert Pain management: pain level controlled Vital Signs Assessment: post-procedure vital signs reviewed and stable Respiratory status: spontaneous breathing, nonlabored ventilation, respiratory function stable and patient connected to nasal cannula oxygen Cardiovascular status: blood pressure returned to baseline and stable Postop Assessment: no apparent nausea or vomiting Anesthetic complications: no   No complications documented.   Last Vitals:  Vitals:   08/24/20 1103 08/24/20 1113  BP: 109/70 108/70  Pulse: (!) 55 (!) 54  Resp: 13 16  Temp:    SpO2: 100% 100%    Last Pain:  Vitals:   08/26/20 0936  TempSrc:   PainSc: 0-No pain                 Yevette Edwards

## 2021-03-19 ENCOUNTER — Other Ambulatory Visit: Payer: Self-pay | Admitting: Physical Medicine & Rehabilitation

## 2021-03-19 DIAGNOSIS — G8929 Other chronic pain: Secondary | ICD-10-CM

## 2021-03-23 ENCOUNTER — Ambulatory Visit
Admission: RE | Admit: 2021-03-23 | Discharge: 2021-03-23 | Disposition: A | Discharge status: Home / Self Care | Payer: Medicare Other | Source: Ambulatory Visit | Attending: Physical Medicine & Rehabilitation | Admitting: Physical Medicine & Rehabilitation

## 2021-03-23 ENCOUNTER — Other Ambulatory Visit: Payer: Self-pay

## 2021-03-23 DIAGNOSIS — M5441 Lumbago with sciatica, right side: Secondary | ICD-10-CM | POA: Diagnosis present

## 2021-03-23 DIAGNOSIS — M5442 Lumbago with sciatica, left side: Secondary | ICD-10-CM | POA: Insufficient documentation

## 2021-03-23 DIAGNOSIS — G8929 Other chronic pain: Secondary | ICD-10-CM | POA: Insufficient documentation

## 2023-03-27 ENCOUNTER — Other Ambulatory Visit: Payer: Self-pay | Admitting: Physician Assistant

## 2023-03-27 DIAGNOSIS — Z1231 Encounter for screening mammogram for malignant neoplasm of breast: Secondary | ICD-10-CM

## 2023-06-04 IMAGING — MR MR LUMBAR SPINE W/O CM
5 series · 30 of 48 positions shown · non-contrast
Comparison: CTA of the chest, abdomen and pelvis 07/17/2015.

CLINICAL DATA: Chronic low back pain for years, progressively
worse. Bilateral hip pain. No known injury.

EXAM:
MRI LUMBAR SPINE WITHOUT CONTRAST
TECHNIQUE: Multiplanar, multisequence MR imaging of the lumbar spine was
performed. No intravenous contrast was administered.

[Series 9: T2 · sagittal · 4.0mm · 0.81mm/px · 6 of 17 slices shown (1 of 2)]
[im 1/17]
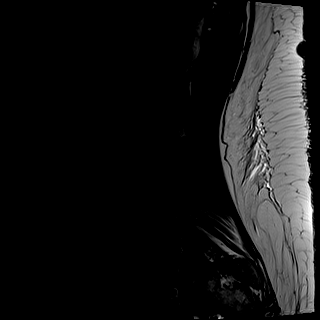
[im 4/17]
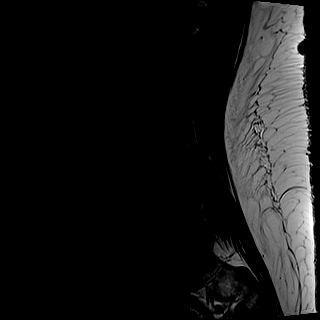
[im 7/17]
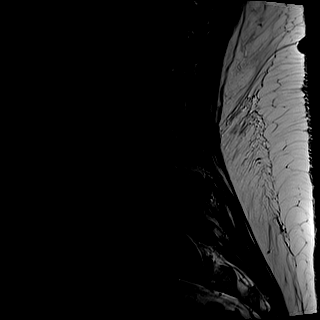
[im 10/17]
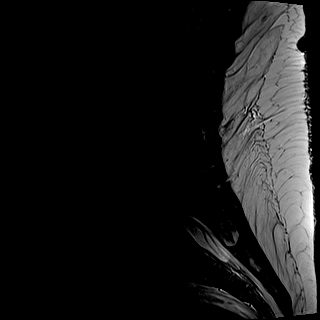
[im 13/17]
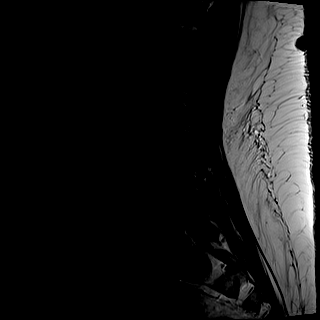
[im 17/17]
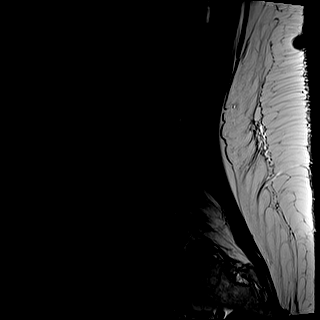

[Series 10: T1 · sagittal · 4.0mm · 0.81mm/px · 7 of 17 slices shown (1 of 2)]
[im 1/17]
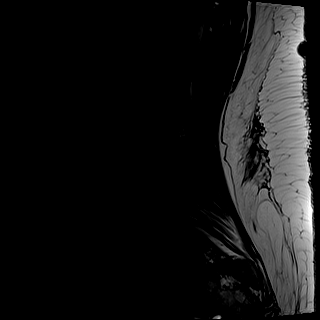
[im 3/17]
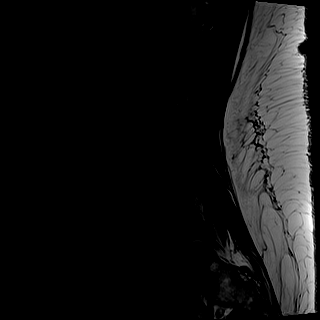
[im 6/17]
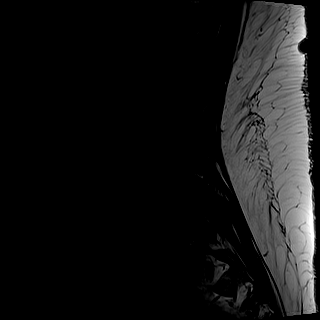
[im 9/17]
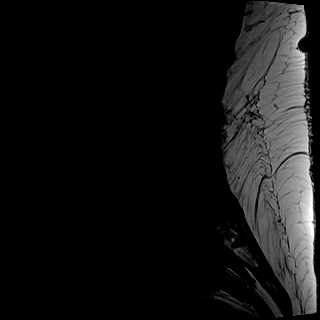
[im 11/17]
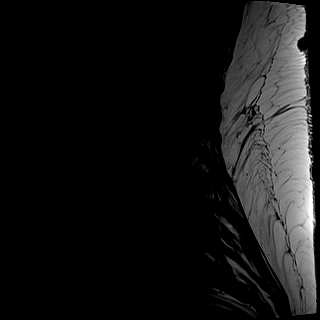
[im 14/17]
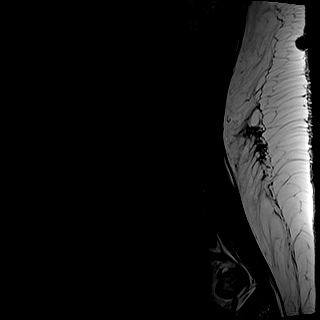
[im 17/17]
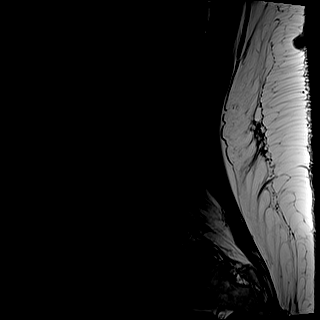

[Series 11: STIR · sagittal · 4.0mm · 0.41mm/px · 1 of 17 slices shown]
[im 1/17]
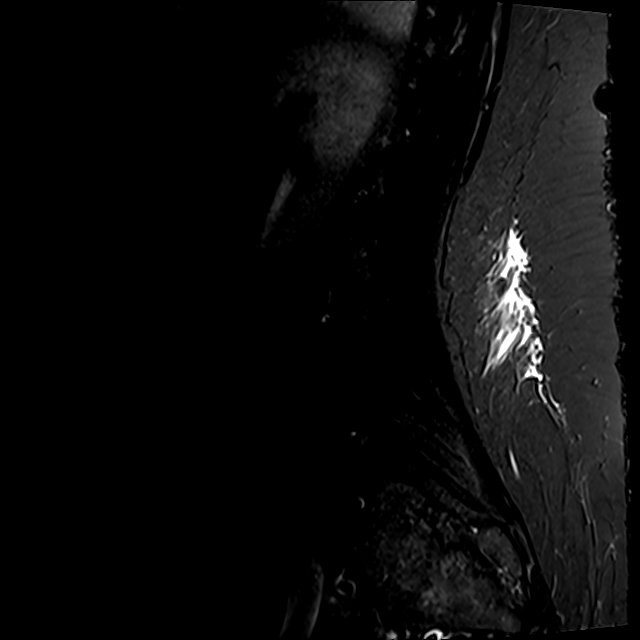

[Series 12: T2 · axial · 4.0mm · 0.78mm/px · z∈[+8,+224]mm · 8 of 35 slices shown (2 of 2)]
[im 1/35]
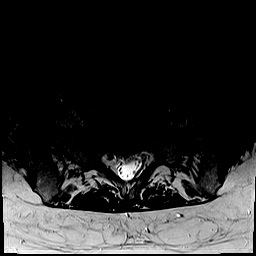
[im 6/35]
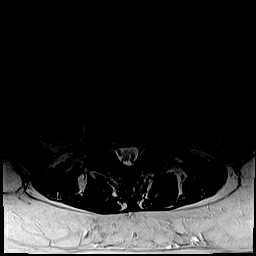
[im 11/35]
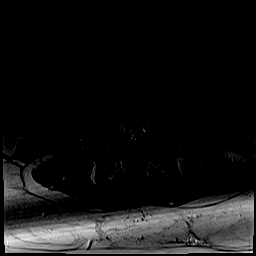
[im 16/35]
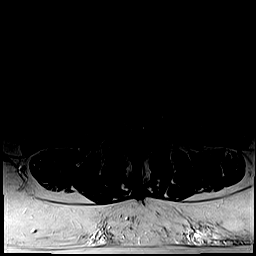
[im 19/35]
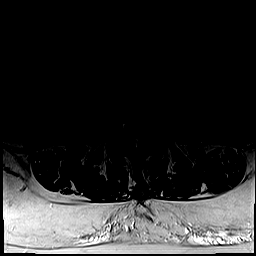
[im 24/35]
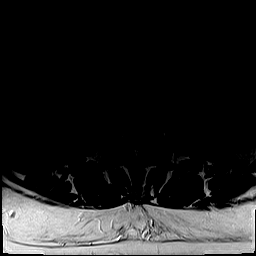
[im 29/35]
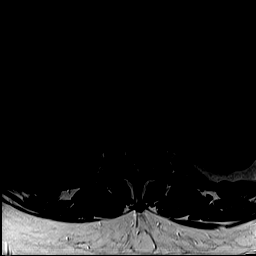
[im 35/35]
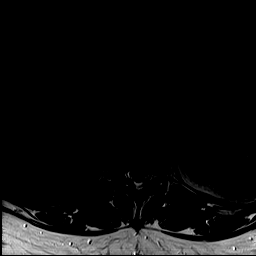

[Series 13: T1 · axial · 4.0mm · 0.39mm/px · z∈[+8,+224]mm · 8 of 35 slices shown (2 of 2)]
[im 1/35]
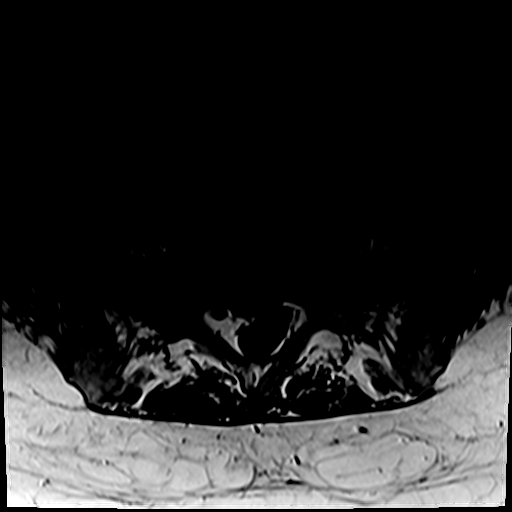
[im 6/35]
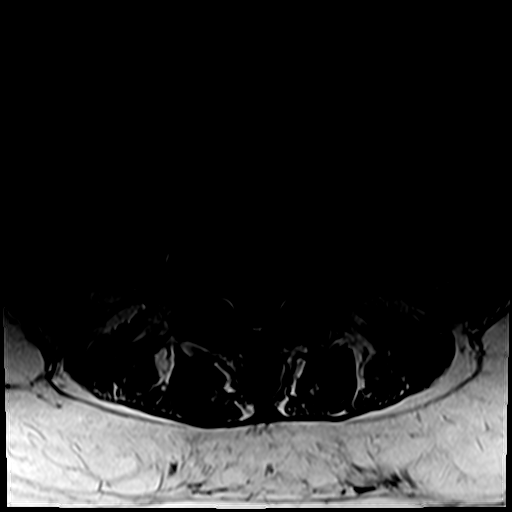
[im 11/35]
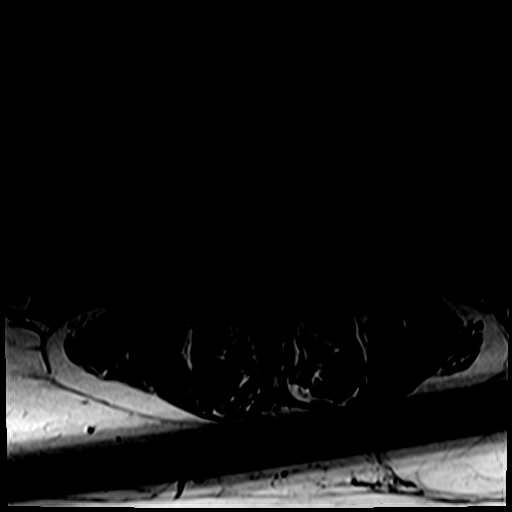
[im 16/35]
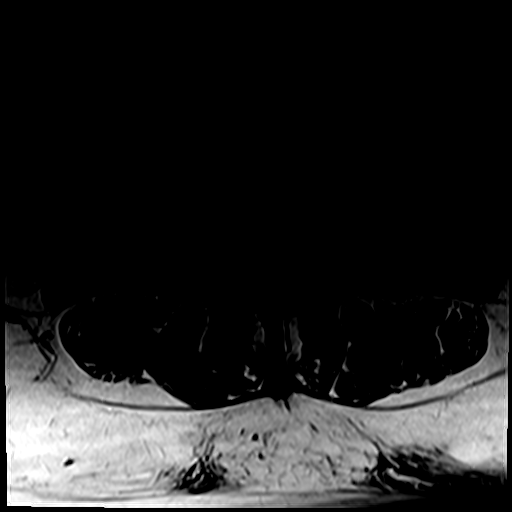
[im 19/35]
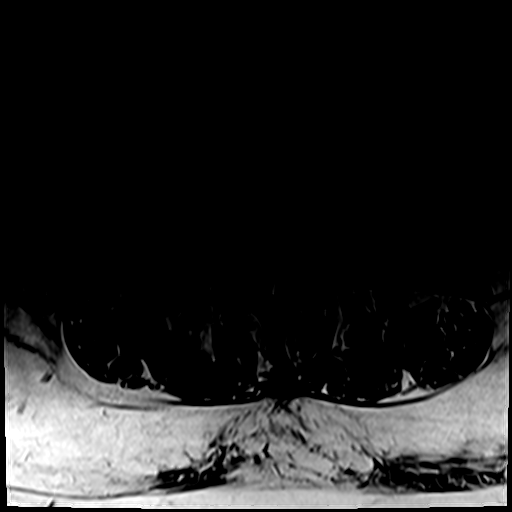
[im 24/35]
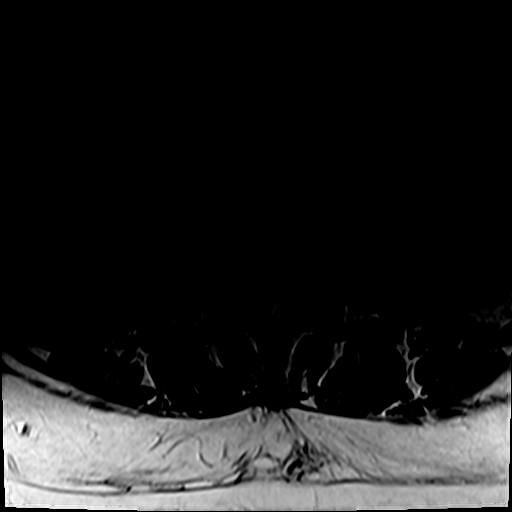
[im 29/35]
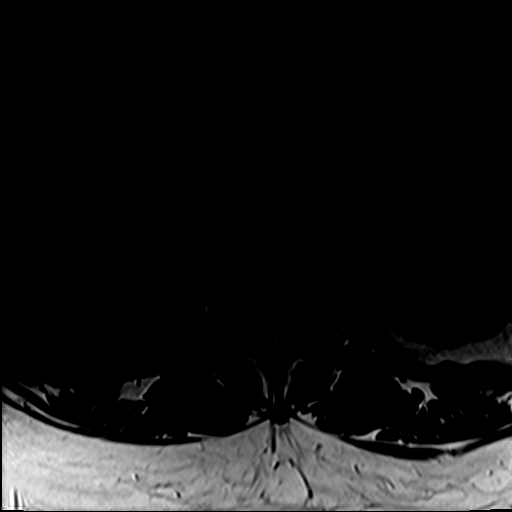
[im 35/35]
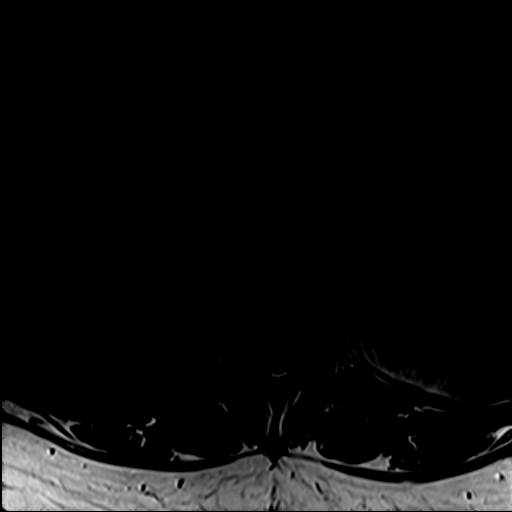

[30 of 48 positions shown; findings below may reference images not displayed]

FINDINGS: Segmentation: Conventional anatomy assumed, with the last open disc
space designated L5-S1.Concordant with previous imaging.

Alignment: 89 mm of degenerative anterolisthesis at L4-5, similar to
previous CT. Otherwise normal.

Vertebrae: No worrisome osseous lesion, acute fracture or pars
defect. Fatty endplate degenerative changes at L5-S1. The visualized
sacroiliac joints appear unremarkable.

Conus medullaris: Extends to the L1-2 level and appears normal.

Paraspinal and other soft tissues: No significant paraspinal
findings.

Disc levels:

Images through the lower thoracic spine demonstrate loss of disc
height with disc bulging and endplate osteophytes at T10-11, T11-12
and T12-L1. No resulting cord deformity or high-grade foraminal
narrowing.

No significant disc space findings at L1-2 or L2-3.

L3-4: Mild disc bulging, facet and ligamentous hypertrophy.
Borderline spinal stenosis without nerve root encroachment.

L4-5: Moderate to advanced bilateral facet hypertrophy accounting
for the anterolisthesis. There is loss of disc height with annular
disc bulging and a small central disc protrusion. Resulting mild to
moderate multifactorial spinal stenosis with moderate lateral recess
and mild foraminal narrowing bilaterally.

L5-S1: Chronic degenerative disc disease with loss of disc height,
endplate osteophytes and mild facet hypertrophy. Mild foraminal
narrowing bilaterally without nerve root encroachment.
IMPRESSION: 1. No acute findings or significant change from abdominal CTA in
4041.
2. Mild to moderate multifactorial spinal stenosis at L4-5 secondary
to facet hypertrophy, a resulting grade 1 anterolisthesis and
diffuse disc degeneration. There is moderate lateral recess and mild
foraminal narrowing bilaterally. This has the potential to worsen
when erect or in flexion.
3. Chronic degenerative disc disease at L5-S1 with mild foraminal
narrowing bilaterally.
4. Lower thoracic spondylosis without acute findings or significant
spinal stenosis.

## 2023-07-31 ENCOUNTER — Encounter

## 2023-08-13 ENCOUNTER — Ambulatory Visit
Admission: RE | Admit: 2023-08-13 | Discharge: 2023-08-13 | Disposition: A | Discharge status: Home / Self Care | Source: Ambulatory Visit | Attending: Physician Assistant | Admitting: Physician Assistant

## 2023-08-13 DIAGNOSIS — Z1231 Encounter for screening mammogram for malignant neoplasm of breast: Secondary | ICD-10-CM | POA: Diagnosis present
